# Patient Record
Sex: Female | Born: 1980 | Race: Black or African American | Hispanic: No | Marital: Single | Smoking: Current every day smoker
Health system: Southern US, Community
[De-identification: ages and names within clinical notes are randomized; demographics above are authoritative.]

## PROBLEM LIST (undated history)

## (undated) DIAGNOSIS — F209 Schizophrenia, unspecified: Secondary | ICD-10-CM

---

## 2002-04-05 ENCOUNTER — Inpatient Hospital Stay (HOSPITAL_COMMUNITY): Admission: AD | Admit: 2002-04-05 | Discharge: 2002-04-05 | Payer: Self-pay | Admitting: *Deleted

## 2002-04-13 ENCOUNTER — Encounter (HOSPITAL_COMMUNITY): Admission: RE | Admit: 2002-04-13 | Discharge: 2002-04-13 | Payer: Self-pay | Admitting: *Deleted

## 2002-04-25 ENCOUNTER — Inpatient Hospital Stay (HOSPITAL_COMMUNITY): Admission: AD | Admit: 2002-04-25 | Discharge: 2002-04-27 | Payer: Self-pay | Admitting: Obstetrics and Gynecology

## 2003-02-15 ENCOUNTER — Inpatient Hospital Stay (HOSPITAL_COMMUNITY): Admission: AD | Admit: 2003-02-15 | Discharge: 2003-02-16 | Payer: Self-pay | Admitting: *Deleted

## 2003-02-16 ENCOUNTER — Encounter: Payer: Self-pay | Admitting: *Deleted

## 2003-02-28 ENCOUNTER — Inpatient Hospital Stay (HOSPITAL_COMMUNITY): Admission: AD | Admit: 2003-02-28 | Discharge: 2003-02-28 | Payer: Self-pay | Admitting: Obstetrics & Gynecology

## 2004-03-07 ENCOUNTER — Emergency Department (HOSPITAL_COMMUNITY): Admission: EM | Admit: 2004-03-07 | Discharge: 2004-03-08 | Payer: Self-pay | Admitting: Emergency Medicine

## 2004-10-28 ENCOUNTER — Emergency Department (HOSPITAL_COMMUNITY): Admission: EM | Admit: 2004-10-28 | Discharge: 2004-10-28 | Payer: Self-pay | Admitting: Emergency Medicine

## 2005-03-20 IMAGING — CR DG CHEST 2V
2 series · 2 of 2 positions shown · non-contrast
Comparison: none

HISTORY: Chest pain, cough, fever

CHEST 2 VIEWS
Heart size, mediastinal contours, and vascularity normal.
Lungs clear.
No pneumothorax or pleural effusion.
Bones unremarkable.

[view not recorded (1 of 2)]
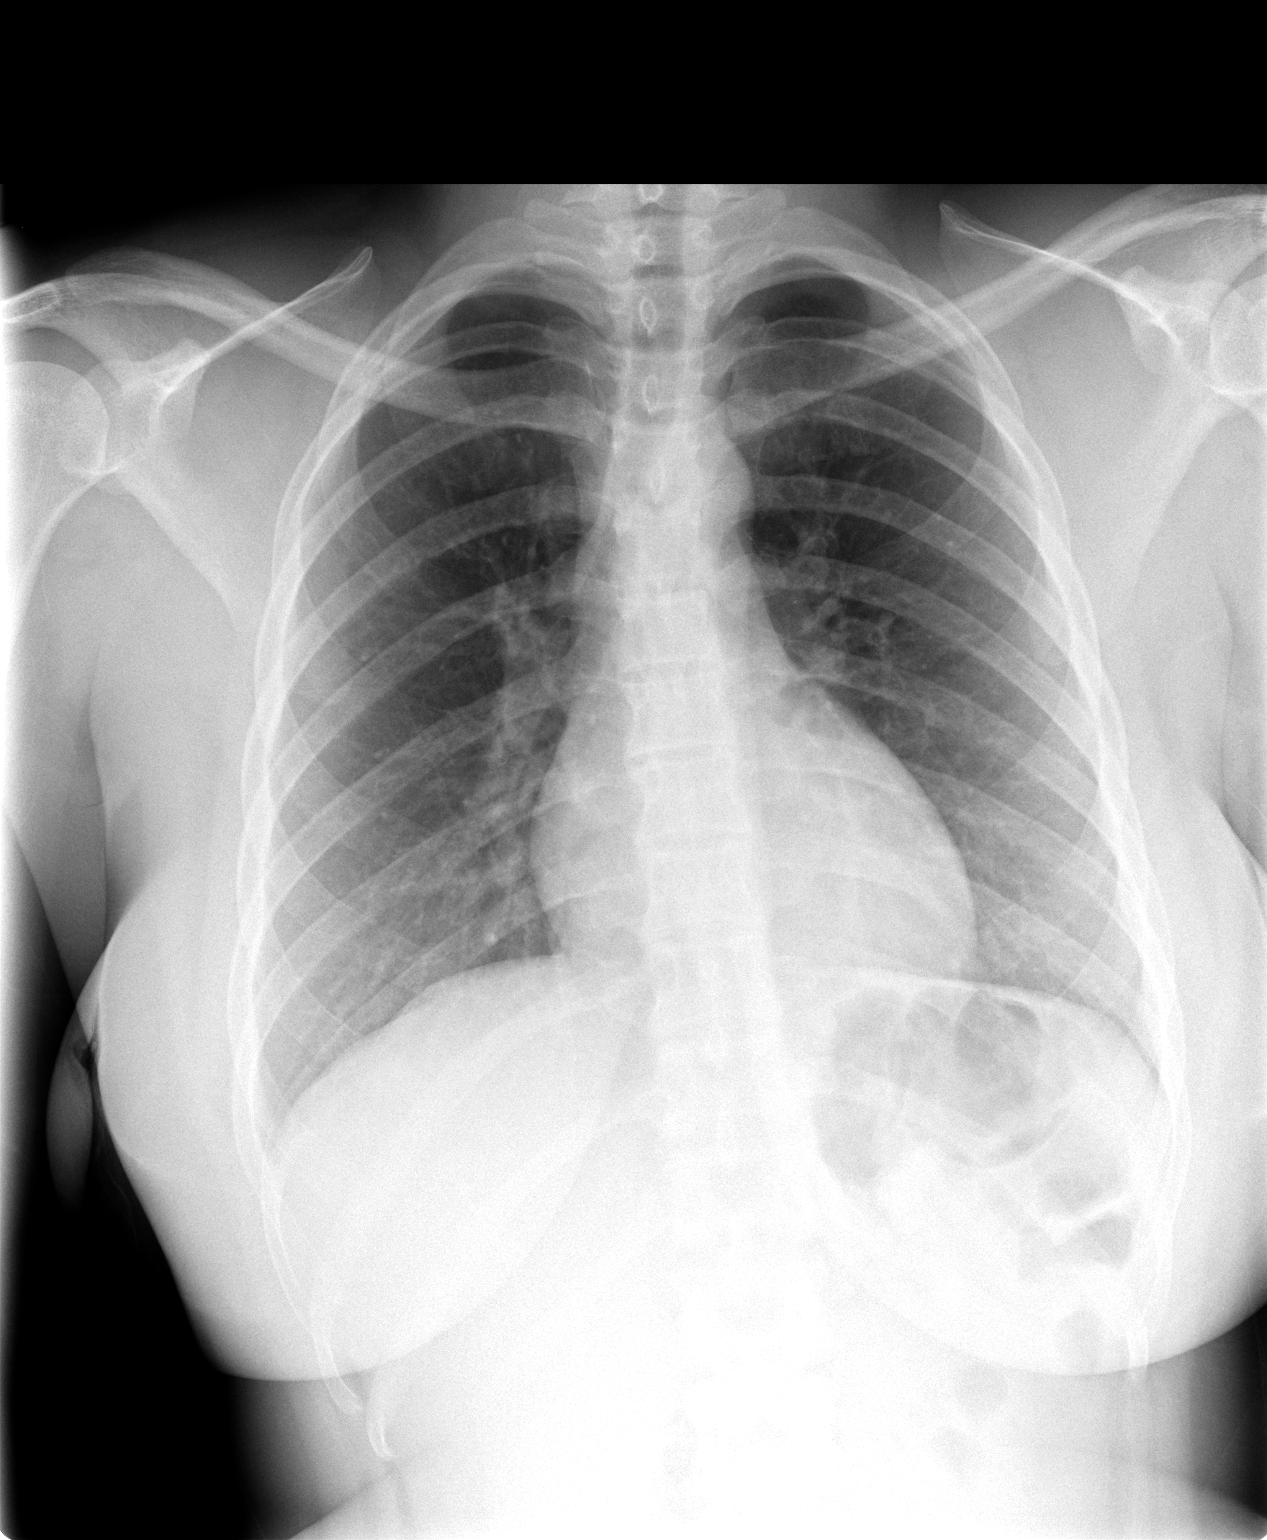

[view not recorded (2 of 2)]
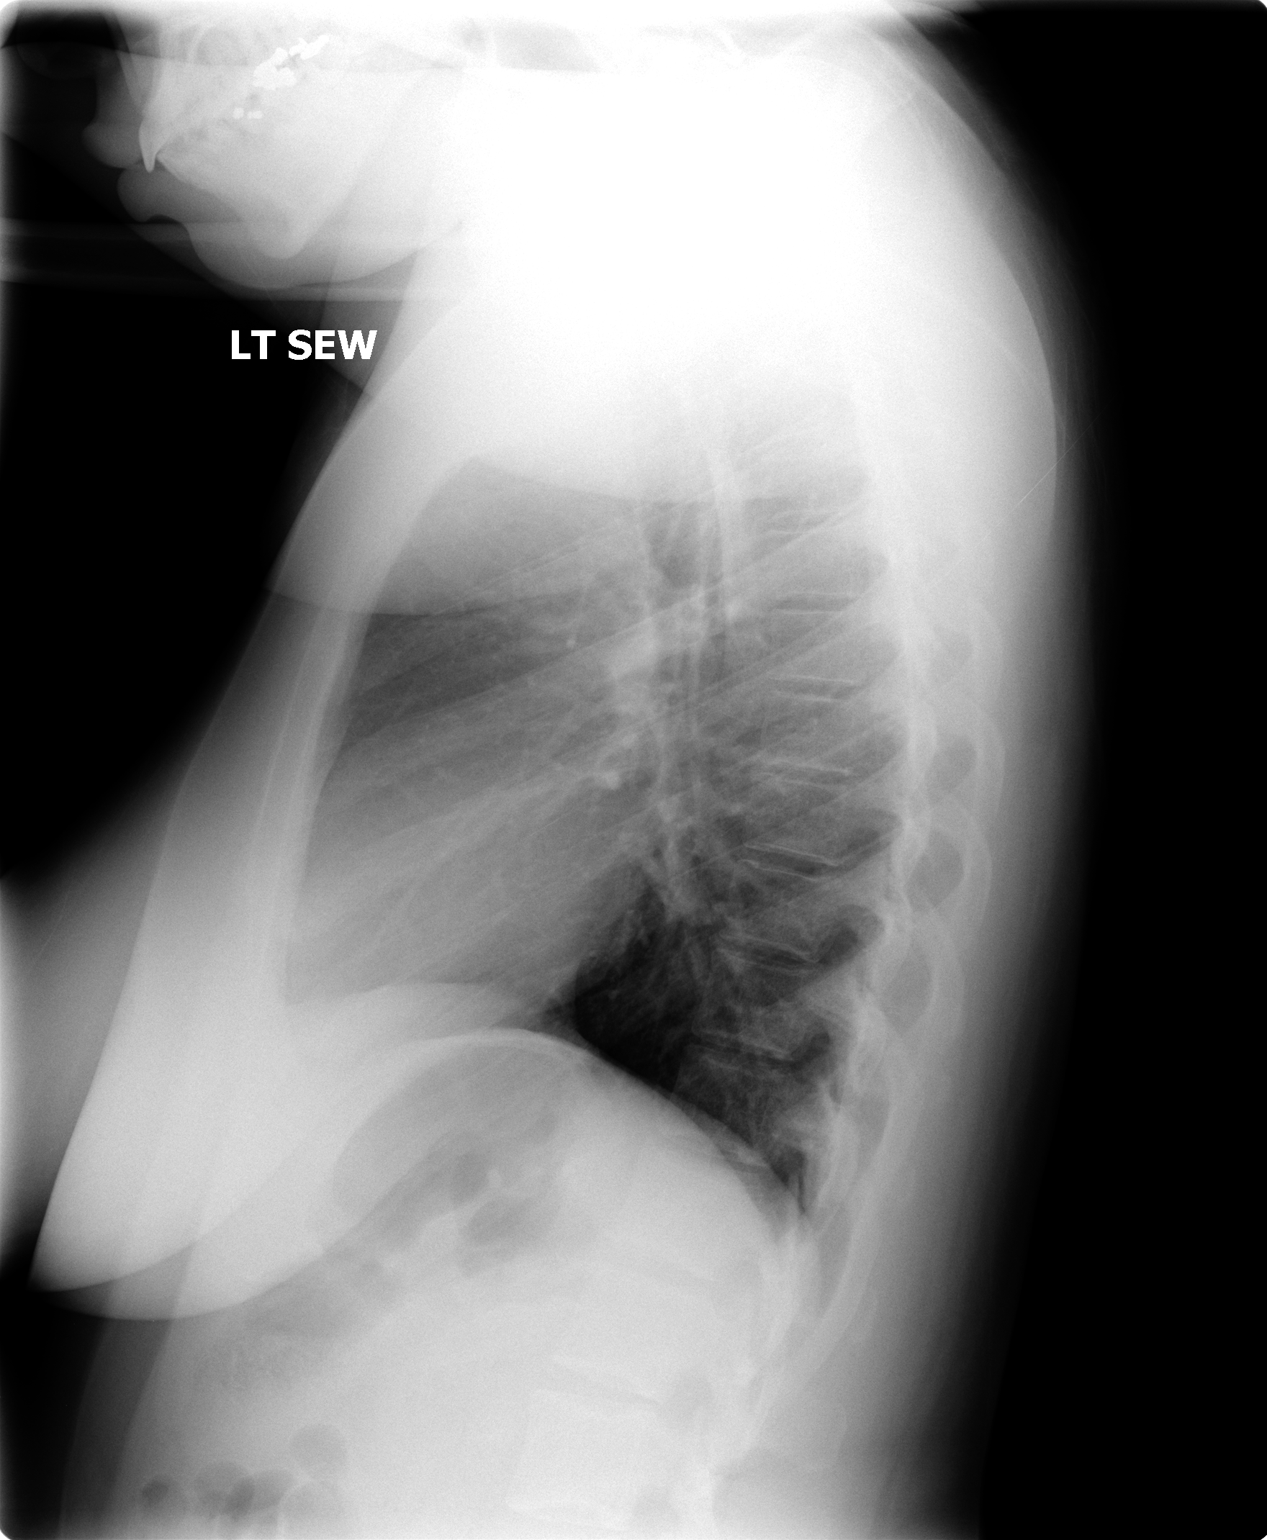

[2 of 2 positions shown; findings below may reference images not displayed]

IMPRESSION: No acute abnormalities.

## 2005-11-10 IMAGING — CR DG HAND COMPLETE 3+V*L*
3 series · 3 of 3 positions shown · non-contrast
Comparison: None.

CLINICAL DATA: Injured left hand.

LEFT HAND - 3 VIEW  10/28/2004:

[view not recorded (1 of 3)]
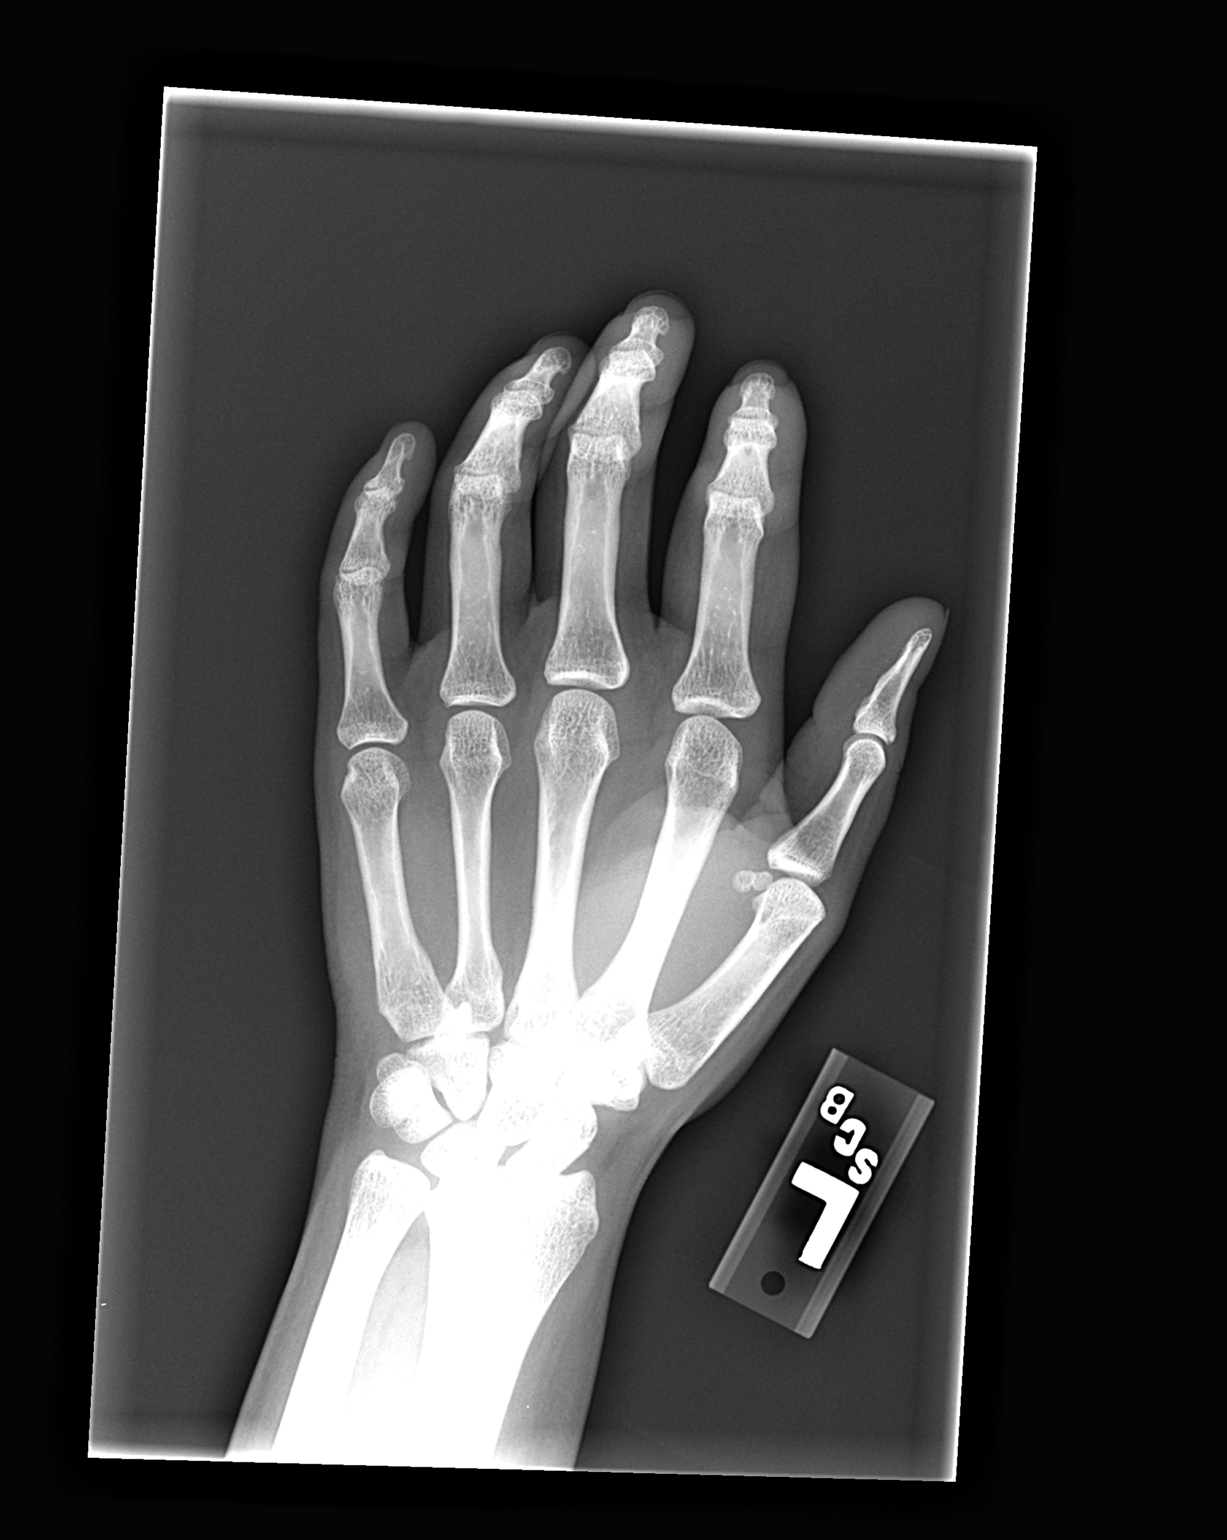

[view not recorded (2 of 3)]
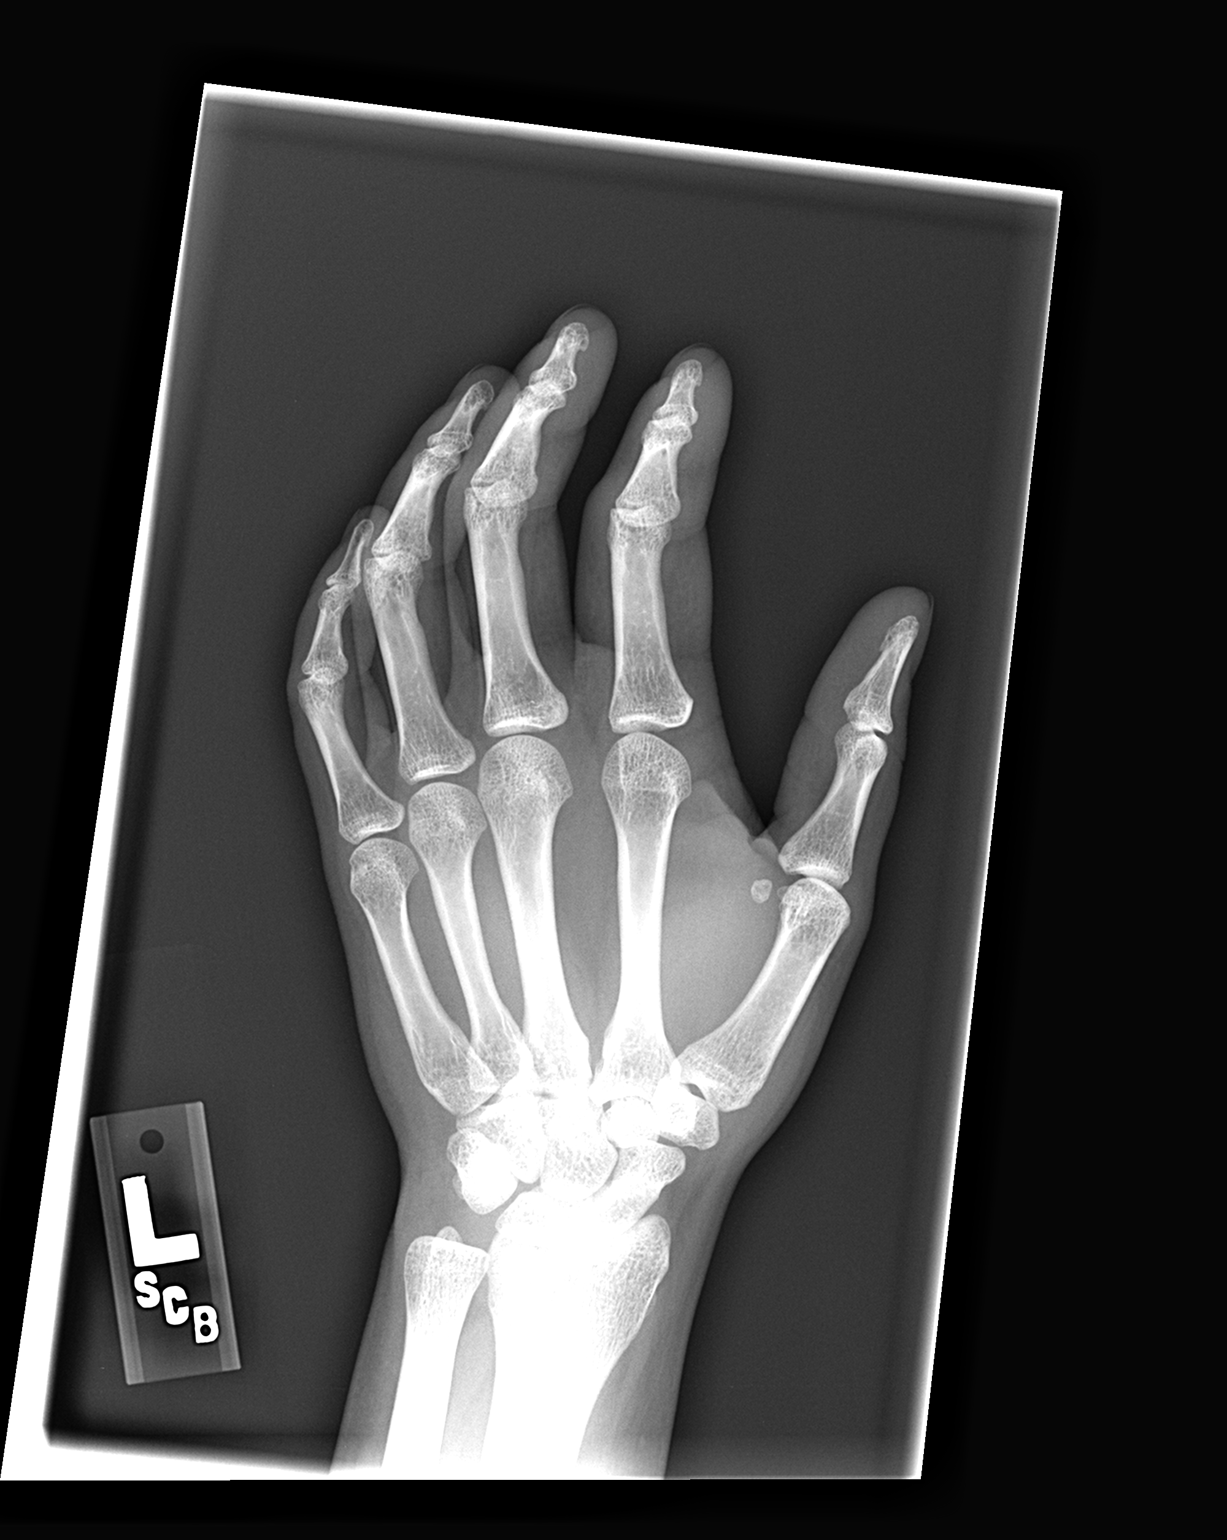

[view not recorded (3 of 3)]
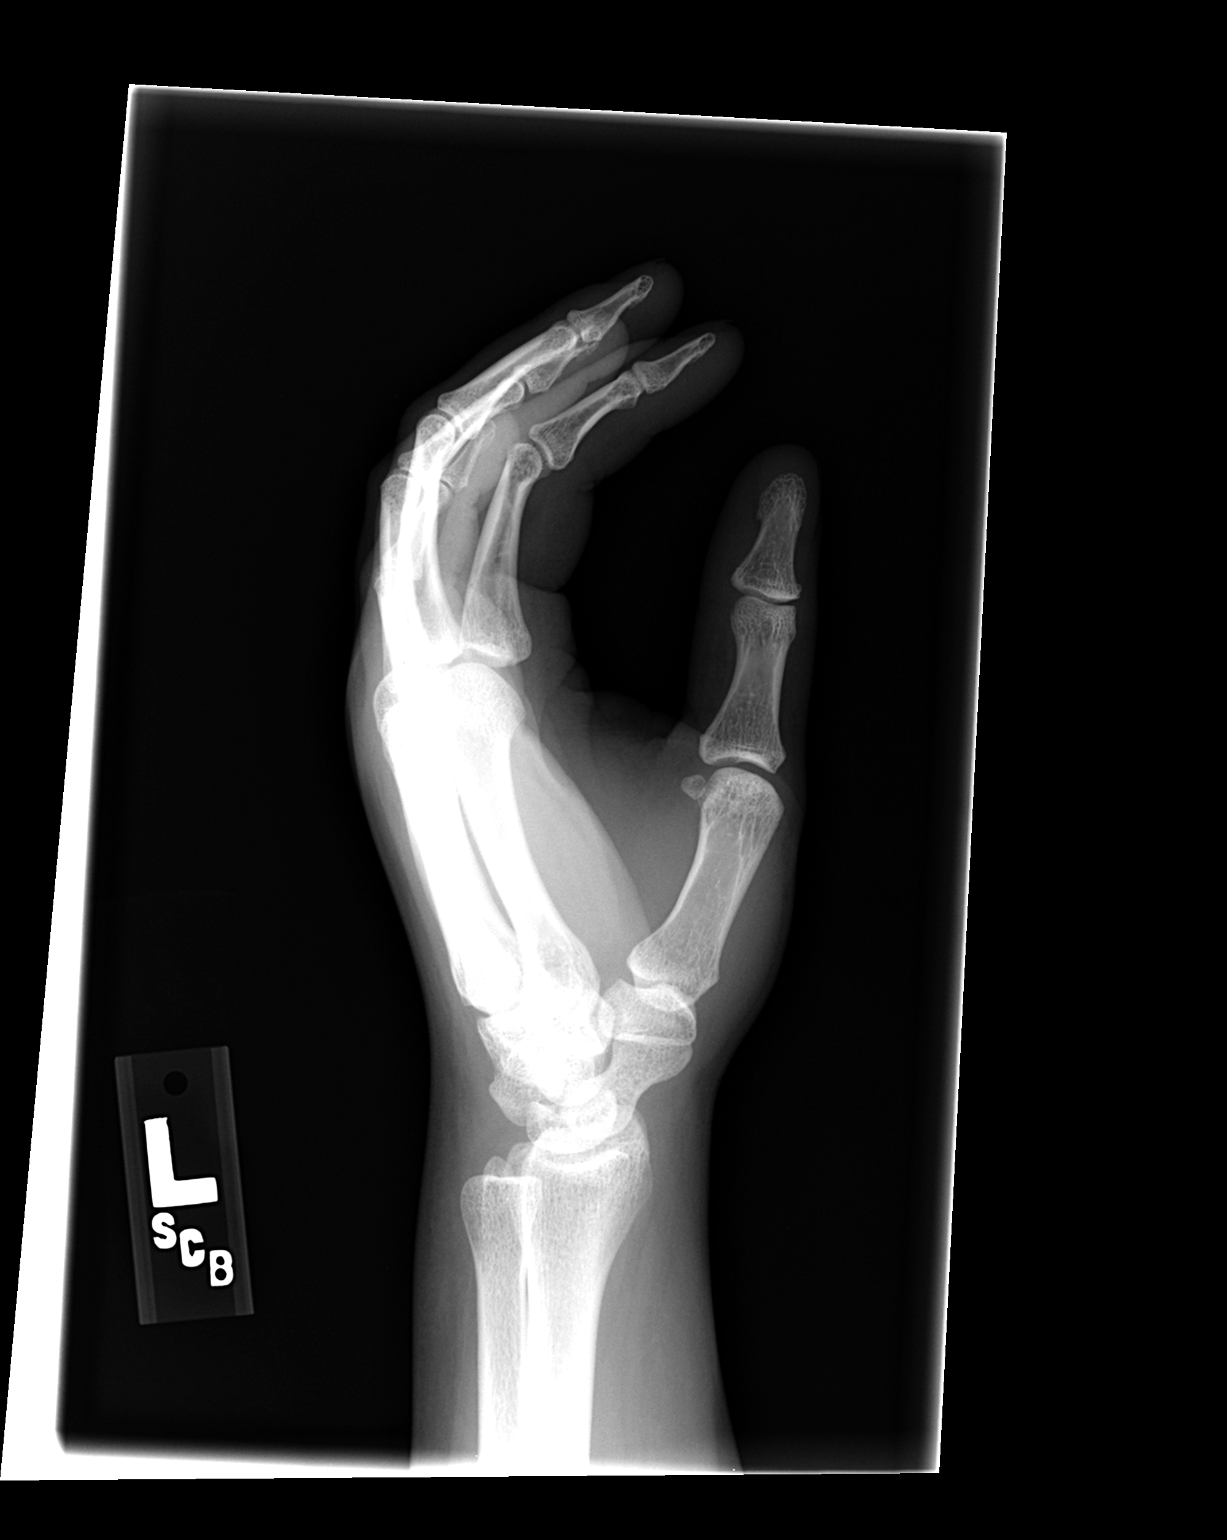

[3 of 3 positions shown; findings below may reference images not displayed]

FINDINGS: There is no evidence of acute fracture or dislocation. The joint
spaces appear well-preserved throughout. There are no intrinsic osseous
abnormalities.
IMPRESSION: Normal examination.

## 2016-08-11 ENCOUNTER — Emergency Department (HOSPITAL_COMMUNITY)
Admission: EM | Admit: 2016-08-11 | Discharge: 2016-08-11 | Disposition: A | Discharge status: Home / Self Care | Payer: Self-pay | Attending: Emergency Medicine | Admitting: Emergency Medicine

## 2016-08-11 ENCOUNTER — Encounter (HOSPITAL_COMMUNITY): Payer: Self-pay | Admitting: Emergency Medicine

## 2016-08-11 DIAGNOSIS — F17200 Nicotine dependence, unspecified, uncomplicated: Secondary | ICD-10-CM | POA: Insufficient documentation

## 2016-08-11 DIAGNOSIS — S29012A Strain of muscle and tendon of back wall of thorax, initial encounter: Secondary | ICD-10-CM | POA: Insufficient documentation

## 2016-08-11 DIAGNOSIS — Z7982 Long term (current) use of aspirin: Secondary | ICD-10-CM | POA: Insufficient documentation

## 2016-08-11 DIAGNOSIS — T148XXA Other injury of unspecified body region, initial encounter: Secondary | ICD-10-CM

## 2016-08-11 DIAGNOSIS — W1830XA Fall on same level, unspecified, initial encounter: Secondary | ICD-10-CM | POA: Insufficient documentation

## 2016-08-11 DIAGNOSIS — Y929 Unspecified place or not applicable: Secondary | ICD-10-CM | POA: Insufficient documentation

## 2016-08-11 DIAGNOSIS — Y999 Unspecified external cause status: Secondary | ICD-10-CM | POA: Insufficient documentation

## 2016-08-11 DIAGNOSIS — Y9301 Activity, walking, marching and hiking: Secondary | ICD-10-CM | POA: Insufficient documentation

## 2016-08-11 DIAGNOSIS — Z79899 Other long term (current) drug therapy: Secondary | ICD-10-CM | POA: Insufficient documentation

## 2016-08-11 MED ORDER — METHOCARBAMOL 500 MG PO TABS
500.0000 mg | ORAL_TABLET | Freq: Once | ORAL | Status: AC
  Administered 2016-08-11: 500 mg via ORAL
  Filled 2016-08-11: qty 1

## 2016-08-11 MED ORDER — ACETAMINOPHEN 500 MG PO TABS
1000.0000 mg | ORAL_TABLET | Freq: Once | ORAL | Status: AC
  Administered 2016-08-11: 1000 mg via ORAL
  Filled 2016-08-11: qty 2

## 2016-08-11 MED ORDER — ACETAMINOPHEN 500 MG PO TABS: 1000.0000 mg | ORAL_TABLET | Freq: Three times a day (TID) | ORAL | 0 refills | Status: AC

## 2016-08-11 MED ORDER — METHOCARBAMOL 500 MG PO TABS: 500.0000 mg | ORAL_TABLET | Freq: Two times a day (BID) | ORAL | 0 refills | Status: AC

## 2016-08-11 MED ORDER — KETOROLAC TROMETHAMINE 60 MG/2ML IM SOLN
30.0000 mg | Freq: Once | INTRAMUSCULAR | Status: DC
  Filled 2016-08-11: qty 2

## 2016-08-11 NOTE — ED Triage Notes (Addendum)
Pt from home with right sided back pain. Pt states she was walking 2 days ago, fell over and has a knot where she fell. Pt sustained no other injuries. Pt is ambulatory. [facility] and she was able to carry her bags with no difficulty.

## 2016-08-11 NOTE — ED Provider Notes (Signed)
[facility]-EMERGENCY DEPT Provider Note   CSN: [id number] Arrival date & time: 08/11/16  1721     History   Chief Complaint Chief Complaint  Patient presents with  . Back Pain  . Tachycardia    HPI Sarah Mcbride is a 36 y.o. female.  The history is provided by the patient.  Back Pain   This is a new problem. The current episode started 2 days ago. The problem occurs constantly. Progression since onset: waxing and waning. The pain is associated with falling (mechanical fall from standing; landing on edge of bed with right side of thorax). The quality of the pain is described as shooting and cramping. The pain does not radiate. The pain is moderate. The symptoms are aggravated by bending, twisting and certain positions. Pertinent negatives include no chest pain, no fever, no headaches, no abdominal pain, no abdominal swelling, no bowel incontinence, no perianal numbness, no bladder incontinence and no paresthesias. She has tried nothing for the symptoms.    History reviewed. No pertinent past medical history.  There are no active problems to display for this patient.   History reviewed. No pertinent surgical history.  OB History    No data available       Home Medications    Prior to Admission medications   Medication Sig Start Date End Date Taking? Authorizing Provider  Aspirin-Acetaminophen-Caffeine (GOODY HEADACHE PO) Take 1 packet by mouth every 2 (two) hours as needed (headache).   Yes Historical Provider, MD  Cal Carb-Mag Hydrox-Simeth (ROLAIDS ADVANCED) 1000-200-40 MG CHEW Chew 2 tablets by mouth as needed (heart burn).   Yes Historical Provider, MD  ibuprofen (ADVIL,MOTRIN) 200 MG tablet Take 400-600 mg by mouth every 6 (six) hours as needed for headache.   Yes Historical Provider, MD  acetaminophen (TYLENOL) 500 MG tablet Take 2 tablets (1,000 mg total) by mouth every 8 (eight) hours. Do not take more than 4000 mg of acetaminophen (Tylenol) in a 24-hour period. Please  note that other medicines that you may be prescribed may have Tylenol as well. 08/11/16 08/16/16  Nira ConnPedro Eduardo Cleophas Yoak, MD  methocarbamol (ROBAXIN) 500 MG tablet Take 1 tablet (500 mg total) by mouth 2 (two) times daily. 08/11/16   Nira ConnPedro Eduardo Bernetta Sutley, MD    Family History No family history on file.  Social History Social History  Substance Use Topics  . Smoking status: Current Every Day Smoker    Packs/day: 0.50  . Smokeless tobacco: Never Used  . Alcohol use Yes     Comment: rarely     Allergies   Flexeril [cyclobenzaprine]; Sulfa antibiotics; and Tramadol   Review of Systems Review of Systems  Constitutional: Negative for fever.  Cardiovascular: Negative for chest pain.  Gastrointestinal: Negative for abdominal pain and bowel incontinence.  Genitourinary: Negative for bladder incontinence.  Musculoskeletal: Positive for back pain.  Neurological: Negative for headaches and paresthesias.   Ten systems are reviewed and are negative for acute change except as noted in the HPI   Physical Exam Updated Vital Signs BP 136/73 (BP Location: Left Arm)   Pulse (!) 131   Temp 98.6 F (37 C) (Oral)   Resp 20   Ht 5\' 1"  (1.549 m)   Wt 168 lb (76.2 kg)   SpO2 96%   BMI 31.74 kg/m   Physical Exam  Constitutional: She is oriented to person, place, and time. She appears well-developed and well-nourished. No distress.  HENT:  Head: Normocephalic and atraumatic.  Nose: Nose normal.  Eyes: Conjunctivae  and EOM are normal. Pupils are equal, round, and reactive to light. Right eye exhibits no discharge. Left eye exhibits no discharge. No scleral icterus.  Neck: Normal range of motion. Neck supple.  Cardiovascular: Normal rate and regular rhythm.  Exam reveals no gallop and no friction rub.   No murmur heard. Pulmonary/Chest: Effort normal and breath sounds normal. No stridor. No respiratory distress. She has no rales.  Abdominal: Soft. She exhibits no distension. There is no  tenderness.  Musculoskeletal: She exhibits no edema.       Cervical back: She exhibits no bony tenderness.       Thoracic back: She exhibits tenderness. She exhibits no bony tenderness.       Lumbar back: She exhibits no tenderness and no bony tenderness.       Back:  TTP to the right paraspinal muscles for the thoracic back  Neurological: She is alert and oriented to person, place, and time.  Skin: Skin is warm and dry. No rash noted. She is not diaphoretic. No erythema.  Psychiatric: She has a normal mood and affect.  Vitals reviewed.    ED Treatments / Results  Labs (all labs ordered are listed, but only abnormal results are displayed) Labs Reviewed - No data to display  EKG  EKG Interpretation None       Radiology No results found.  Procedures Procedures (including critical care time)  Medications Ordered in ED Medications  acetaminophen (TYLENOL) tablet 1,000 mg (not administered)  methocarbamol (ROBAXIN) tablet 500 mg (not administered)     Initial Impression / Assessment and Plan / ED Course  I have reviewed the triage vital signs and the nursing notes.  Pertinent labs & imaging results that were available during my care of the patient were reviewed by me and considered in my medical decision making (see chart for details).     36 y.o. female presents with back pain following mechanical fall. No red flag symptoms of fever, weight loss, saddle anesthesia, weakness, fecal/urinary incontinence or urinary retention. No urinary symptoms suggestive of UTI/pyelonephritis. Not consistent with renal colic. Abdomen benign and not suggestive of biliary disease. Low suspicion for rib fracture.  Suspect MSK etiology. No indication for imaging emergently. Patient was recommended to take short course of scheduled Tylenol and engage in early mobility as definitive treatment. Return precautions discussed for worsening or new concerning symptoms.    Final Clinical  Impressions(s) / ED Diagnoses   Final diagnoses:  Muscle strain   Disposition: Discharge  Condition: Good  I have discussed the results, Dx and Tx plan with the patient who expressed understanding and agree(s) with the plan. Discharge instructions discussed at great length. The patient was given strict return precautions who verbalized understanding of the instructions. No further questions at time of discharge.    New Prescriptions   ACETAMINOPHEN (TYLENOL) 500 MG TABLET    Take 2 tablets (1,000 mg total) by mouth every 8 (eight) hours. Do not take more than 4000 mg of acetaminophen (Tylenol) in a 24-hour period. Please note that other medicines that you may be prescribed may have Tylenol as well.   METHOCARBAMOL (ROBAXIN) 500 MG TABLET    Take 1 tablet (500 mg total) by mouth 2 (two) times daily.    Follow Up: primary care provider  Schedule an appointment as soon as possible for a visit  As needed      Nira Conn, MD 08/11/16 1942

## 2016-08-12 ENCOUNTER — Emergency Department (HOSPITAL_COMMUNITY)
Admission: EM | Admit: 2016-08-12 | Discharge: 2016-08-13 | Disposition: A | Discharge status: Home / Self Care | Payer: Federal, State, Local not specified - Other | Attending: Emergency Medicine | Admitting: Emergency Medicine

## 2016-08-12 ENCOUNTER — Encounter (HOSPITAL_COMMUNITY): Payer: Self-pay | Admitting: Emergency Medicine

## 2016-08-12 DIAGNOSIS — F1910 Other psychoactive substance abuse, uncomplicated: Secondary | ICD-10-CM

## 2016-08-12 DIAGNOSIS — Z79899 Other long term (current) drug therapy: Secondary | ICD-10-CM | POA: Insufficient documentation

## 2016-08-12 DIAGNOSIS — F17200 Nicotine dependence, unspecified, uncomplicated: Secondary | ICD-10-CM | POA: Insufficient documentation

## 2016-08-12 DIAGNOSIS — F23 Brief psychotic disorder: Secondary | ICD-10-CM

## 2016-08-12 DIAGNOSIS — F312 Bipolar disorder, current episode manic severe with psychotic features: Secondary | ICD-10-CM | POA: Diagnosis present

## 2016-08-12 DIAGNOSIS — Z7982 Long term (current) use of aspirin: Secondary | ICD-10-CM | POA: Insufficient documentation

## 2016-08-12 DIAGNOSIS — F29 Unspecified psychosis not due to a substance or known physiological condition: Secondary | ICD-10-CM | POA: Insufficient documentation

## 2016-08-12 DIAGNOSIS — F419 Anxiety disorder, unspecified: Secondary | ICD-10-CM

## 2016-08-12 HISTORY — DX: Schizophrenia, unspecified: F20.9

## 2016-08-12 LAB — COMPREHENSIVE METABOLIC PANEL
ALBUMIN: 4.2 g/dL (ref 3.5–5.0)
ALK PHOS: 101 U/L (ref 38–126)
ALT: 24 U/L (ref 14–54)
AST: 29 U/L (ref 15–41)
Anion gap: 12 (ref 5–15)
BILIRUBIN TOTAL: 0.9 mg/dL (ref 0.3–1.2)
BUN: 11 mg/dL (ref 6–20)
CO2: 22 mmol/L (ref 22–32)
Calcium: 9.4 mg/dL (ref 8.9–10.3)
Chloride: 102 mmol/L (ref 101–111)
Creatinine, Ser: 0.79 mg/dL (ref 0.44–1.00)
GFR calc Af Amer: >60 mL/min (ref >60)
GFR calc non Af Amer: >60 mL/min (ref >60)
GLUCOSE: 130 mg/dL — AB (ref 65–99)
POTASSIUM: 3.7 mmol/L (ref 3.5–5.1)
SODIUM: 136 mmol/L (ref 135–145)
Total Protein: 8.1 g/dL (ref 6.5–8.1)

## 2016-08-12 LAB — CBC WITH DIFFERENTIAL/PLATELET
BASOS PCT: 0 %
Basophils Absolute: 0 10*3/uL (ref 0.0–0.1)
EOS ABS: 0.1 10*3/uL (ref 0.0–0.7)
EOS PCT: 1 %
HEMATOCRIT: 34.6 % — AB (ref 36.0–46.0)
HEMOGLOBIN: 11.2 g/dL — AB (ref 12.0–15.0)
LYMPHS PCT: 37 %
Lymphs Abs: 4.3 10*3/uL — ABNORMAL HIGH (ref 0.7–4.0)
MCH: 25.3 pg — AB (ref 26.0–34.0)
MCHC: 32.4 g/dL (ref 30.0–36.0)
MCV: 78.1 fL (ref 78.0–100.0)
MONOS PCT: 12 %
Monocytes Absolute: 1.4 10*3/uL — ABNORMAL HIGH (ref 0.1–1.0)
NEUTROS ABS: 5.7 10*3/uL (ref 1.7–7.7)
Neutrophils Relative %: 50 %
Platelets: 257 10*3/uL (ref 150–400)
RBC: 4.43 MIL/uL (ref 3.87–5.11)
RDW: 17.2 % — ABNORMAL HIGH (ref 11.5–15.5)
WBC: 11.5 10*3/uL — ABNORMAL HIGH (ref 4.0–10.5)

## 2016-08-12 LAB — ETHANOL: Alcohol, Ethyl (B): <5 mg/dL (ref <5)

## 2016-08-12 MED ORDER — ONDANSETRON HCL 4 MG PO TABS: 4.0000 mg | ORAL_TABLET | Freq: Three times a day (TID) | ORAL | Status: DC | PRN

## 2016-08-12 MED ORDER — LORAZEPAM 1 MG PO TABS
1.0000 mg | ORAL_TABLET | Freq: Three times a day (TID) | ORAL | Status: DC | PRN
  Administered 2016-08-12 – 2016-08-13 (×2): 1 mg via ORAL
  Filled 2016-08-12 (×2): qty 1

## 2016-08-12 MED ORDER — ZIPRASIDONE MESYLATE 20 MG IM SOLR
10.0000 mg | Freq: Once | INTRAMUSCULAR | Status: AC
  Administered 2016-08-12: 10 mg via INTRAMUSCULAR
  Filled 2016-08-12: qty 20

## 2016-08-12 MED ORDER — IBUPROFEN 400 MG PO TABS: 600.0000 mg | ORAL_TABLET | Freq: Three times a day (TID) | ORAL | Status: DC | PRN

## 2016-08-12 MED ORDER — LORAZEPAM 2 MG/ML IJ SOLN
1.0000 mg | Freq: Once | INTRAMUSCULAR | Status: AC
  Administered 2016-08-12: 1 mg via INTRAMUSCULAR
  Filled 2016-08-12: qty 1

## 2016-08-12 NOTE — ED Notes (Signed)
Pt was given snack 

## 2016-08-12 NOTE — ED Notes (Signed)
Pt walked out of room saying she left her phone in the front lobby. Pt started wandering around the unit trying to find where she left it. This RN tried to redirect pt into room, reassuring pt that security would look for her phone. Then pt swatted at this RN's hand. Pt went back into room. Security at bedside. MD aware.

## 2016-08-12 NOTE — ED Notes (Signed)
Belongings are locked up behind nurse station (Pod F 1-6). Valuables are locked up with security.

## 2016-08-12 NOTE — ED Notes (Addendum)
Regular Diet was ordered for Lunch, a pack of Cookies and Drink placed on pt. Bedside table.

## 2016-08-12 NOTE — ED Notes (Signed)
Pt made first phone call  

## 2016-08-12 NOTE — ED Notes (Signed)
Pt went back to resting after TTS was complete.

## 2016-08-12 NOTE — ED Notes (Addendum)
Pt did not eat snack nor did she drink anything. Pt is still resting

## 2016-08-12 NOTE — ED Notes (Signed)
Pt wanded by security. Changed into paper scrubs. Belongings placed in Pod A nurse's station, not inventoried at this time.

## 2016-08-12 NOTE — ED Notes (Signed)
Pt is resting.
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
## 2016-08-12 NOTE — ED Notes (Addendum)
Pt still resting. Pt did not eat breakfast tray.
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
## 2016-08-12 NOTE — ED Notes (Signed)
Dinner tray at bedside

## 2016-08-12 NOTE — ED Notes (Signed)
Pt lunch tray at bedside  

## 2016-08-12 NOTE — ED Notes (Signed)
Pt breakfast tray at bedside. Pt is resting.

## 2016-08-12 NOTE — ED Notes (Signed)
Pt is eating lunch and waiting it TTS

## 2016-08-12 NOTE — ED Notes (Signed)
Pt resting.

## 2016-08-12 NOTE — ED Provider Notes (Signed)
[facility]-EMER[facility]ENCY DEPT Provider Note   CSN: [id number] Arrival date & time: 08/12/16  0303  By signing my name below, I, Elder NegusRussell Johnston, attest that this documentation has been prepared under the direction and in the presence of Gilda Creasehristopher J Tatijana Bierly, MD. Electronically Signed: Elder Negusussell Johnston, Scribe. 08/12/16. 3:28 AM.   History   Chief Complaint Chief Complaint  Patient presents with  . Psychiatric Evaluation   LEVEL 5 CAVEAT DUE TO PSYCHIATRIC PROBLEM  HPI Sarah Mcbride is a 36 y.o. female with history of schizophrenia reportedly on Seroquil and Ativan presents to the ED for psychiatric evaluation. This patient states that "she is freaking out" because "she cannot talk to her mother". She then proceeds to state that "her mother killed her sister and the police had to pull her out of the river." She denies any suicidal or homicidal ideations. She does report being on psychiatric medications; but states that "she lost them" and hasn't been taking them "for a few weeks". She has not been sleeping for "3 days".   The history is provided by the patient. No language interpreter was used.    Past Medical History:  Diagnosis Date  . Schizophrenia (HCC)     There are no active problems to display for this patient.   History reviewed. No pertinent surgical history.  OB History    No data available       Home Medications    Prior to Admission medications   Medication Sig Start Date End Date Taking? Authorizing Provider  acetaminophen (TYLENOL) 500 MG tablet Take 2 tablets (1,000 mg total) by mouth every 8 (eight) hours. Do not take more than 4000 mg of acetaminophen (Tylenol) in a 24-hour period. Please note that other medicines that you may be prescribed may have Tylenol as well. 08/11/16 08/16/16  Nira ConnPedro Eduardo Cardama, MD  Aspirin-Acetaminophen-Caffeine (GOODY HEADACHE PO) Take 1 packet by mouth every 2 (two) hours as needed (headache).    Historical Provider, MD  Cal  Carb-Mag Hydrox-Simeth (ROLAIDS ADVANCED) 1000-200-40 MG CHEW Chew 2 tablets by mouth as needed (heart burn).    Historical Provider, MD  ibuprofen (ADVIL,MOTRIN) 200 MG tablet Take 400-600 mg by mouth every 6 (six) hours as needed for headache.    Historical Provider, MD  methocarbamol (ROBAXIN) 500 MG tablet Take 1 tablet (500 mg total) by mouth 2 (two) times daily. 08/11/16   Nira ConnPedro Eduardo Cardama, MD    Family History No family history on file.  Social History Social History  Substance Use Topics  . Smoking status: Current Every Day Smoker    Packs/day: 0.50  . Smokeless tobacco: Never Used  . Alcohol use Yes     Comment: rarely     Allergies   Flexeril [cyclobenzaprine]; Sulfa antibiotics; and Tramadol   Review of Systems Review of Systems  Unable to perform ROS: Psychiatric disorder     Physical Exam Updated Vital Signs Ht 5\' 1"  (1.549 m)   Wt 168 lb (76.2 kg)   BMI 31.74 kg/m   Physical Exam  Constitutional: She is oriented to person, place, and time. She appears well-developed and well-nourished. No distress.  HENT:  Head: Normocephalic and atraumatic.  Right Ear: Hearing normal.  Left Ear: Hearing normal.  Nose: Nose normal.  Mouth/Throat: Oropharynx is clear and moist and mucous membranes are normal.  Eyes: Conjunctivae and EOM are normal. Pupils are equal, round, and reactive to light.  Neck: Normal range of motion. Neck supple.  Cardiovascular: Regular rhythm, S1 normal and  S2 normal.  Exam reveals no gallop and no friction rub.   No murmur heard. Pulmonary/Chest: Effort normal and breath sounds normal. No respiratory distress. She exhibits no tenderness.  Abdominal: Soft. Normal appearance and bowel sounds are normal. There is no hepatosplenomegaly. There is no tenderness. There is no rebound, no guarding, no tenderness at McBurney's point and negative Murphy's sign. No hernia.  Musculoskeletal: Normal range of motion.  Neurological: She is alert and  oriented to person, place, and time. She has normal strength. No cranial nerve deficit or sensory deficit. Coordination normal. GCS eye subscore is 4. GCS verbal subscore is 5. GCS motor subscore is 6.  Skin: Skin is warm, dry and intact. No rash noted. No cyanosis.  Psychiatric: Her mood appears anxious. Her affect is labile. Her speech is rapid and/or pressured. She is agitated and hyperactive. Thought content is paranoid and delusional.  Nursing note and vitals reviewed.    ED Treatments / Results  Labs (all labs ordered are listed, but only abnormal results are displayed) Labs Reviewed  COMPREHENSIVE METABOLIC PANEL  ETHANOL  CBC WITH DIFFERENTIAL/PLATELET  RAPID URINE DRUG SCREEN, HOSP PERFORMED    EKG  EKG Interpretation None       Radiology No results found.  Procedures Procedures (including critical care time)  Medications Ordered in ED Medications  ziprasidone (GEODON) injection 10 mg (not administered)  LORazepam (ATIVAN) injection 1 mg (not administered)     Initial Impression / Assessment and Plan / ED Course  I have reviewed the triage vital signs and the nursing notes.  Pertinent labs & imaging results that were available during my care of the patient were reviewed by me and considered in my medical decision making (see chart for details).     Patient presents with extreme agitation. She has a history of paranoid schizophrenia. Patient has been off her medications for an unknown period of time. She is paranoid and delusional, will require psychiatric evaluation for further treatment.  Final Clinical Impressions(s) / ED Diagnoses   Final diagnoses:  Acute psychosis    New Prescriptions New Prescriptions   No medications on file  I personally performed the services described in this documentation, which was scribed in my presence. The recorded information has been reviewed and is accurate.    Gilda Crease, MD 08/12/16 [phone]

## 2016-08-13 ENCOUNTER — Inpatient Hospital Stay (HOSPITAL_COMMUNITY)
Admission: AD | Admit: 2016-08-13 | Payer: Federal, State, Local not specified - Other | Source: Intra-hospital | Admitting: Psychiatry

## 2016-08-13 DIAGNOSIS — F17210 Nicotine dependence, cigarettes, uncomplicated: Secondary | ICD-10-CM | POA: Diagnosis not present

## 2016-08-13 DIAGNOSIS — F23 Brief psychotic disorder: Secondary | ICD-10-CM | POA: Diagnosis not present

## 2016-08-13 DIAGNOSIS — F312 Bipolar disorder, current episode manic severe with psychotic features: Secondary | ICD-10-CM | POA: Diagnosis present

## 2016-08-13 DIAGNOSIS — Z79899 Other long term (current) drug therapy: Secondary | ICD-10-CM

## 2016-08-13 DIAGNOSIS — F1290 Cannabis use, unspecified, uncomplicated: Secondary | ICD-10-CM | POA: Diagnosis not present

## 2016-08-13 LAB — RAPID URINE DRUG SCREEN, HOSP PERFORMED
Amphetamines: NOT DETECTED
BARBITURATES: NOT DETECTED
Benzodiazepines: POSITIVE — AB
Cocaine: NOT DETECTED
Opiates: NOT DETECTED
TETRAHYDROCANNABINOL: POSITIVE — AB

## 2016-08-13 LAB — POC URINE PREG, ED: PREG TEST UR: NEGATIVE

## 2016-08-13 MED ORDER — QUETIAPINE FUMARATE 25 MG PO TABS
25.0000 mg | ORAL_TABLET | Freq: Every day | ORAL | Status: DC
  Administered 2016-08-13: 25 mg via ORAL
  Filled 2016-08-13: qty 1

## 2016-08-13 MED ORDER — QUETIAPINE FUMARATE 25 MG PO TABS: ORAL_TABLET | ORAL | 0 refills | Status: AC

## 2016-08-13 NOTE — ED Notes (Signed)
Pt given breakfast tray

## 2016-08-13 NOTE — ED Notes (Addendum)
Patient stated she need to get her father phone Number, so we had to ask Security if we could get pt. Phone to get the Phone Number, the pt. Got number and wrote it on a piece of paper. We had to do a new security envelope too lock up, and Patient Belongings was placed in soiled Utility room in [place].

## 2016-08-13 NOTE — ED Notes (Signed)
Pt given lunch tray.

## 2016-08-13 NOTE — Consult Note (Signed)
Telepsych Consultation   Reason for Consult:  Psychosis Referring Physician:  EDP Patient Identification: TERENCE GOOGE MRN:  [id number] Principal Diagnosis: Severe manic bipolar 1 disorder with psychotic behavior [facility]) Diagnosis:   Patient Active Problem List   Diagnosis Date Noted  . Severe manic bipolar 1 disorder with psychotic behavior ([place]) [F31.2] 08/13/2016    Priority: High  . Acute psychosis [F23]     Total Time spent with patient: 30 minutes  Subjective:   Amandamarie D Garant is a 36 y.o. female patient admitted with reports of erratic behavior consistent with mania and pt report of being awake for 3 days. Pt seen and chart reviewed. Pt is alert/oriented x3, very anxious, agitated, and struggling to focus on the assessment. Pt denies suicidal/homicidal ideation and psychosis. However, pt does appear to be responding to internal stimuli as her eyes are shifting away from the camera often. Additionally, nursing staff report that pt has been erratic, agitated, hostile, and cursing loudly at staff members in addition to being seen having full conversations with herself in her room this morning. Based on this information, pt will benefit from inpatient hospitalization for stabilization, if she chooses to sign in voluntarily. Careful face-to-face reevaluation by Dr. Eulis Foster, EDP, finds that the pt was transiently agitated/labile and is stable enough to go home, not meeting IVC criteria.  Pt does report hx of taking Seroquel 11m po bid and 526mpo qhs which is consistent with what we might start a new patient on. We can start this med at this dose and titrate upward and use PRN's as needed in the meantime (such as Zyprexa 25m225mydis po or IM q8h prn agitation). Qtc 454 and safe for these meds.    HPI:  I have reviewed and concur with HPI elements below, modified as follows: "Vala D FeiMungin an [age]o. female. Per EDP, Dr. PolWaverly FerrariCandis D Feimsteris a [age]o.femalewith history of  schizophrenia reportedly on Seroquil and Ativan presents to the ED for psychiatric evaluation. This patient states that "she is freaking out" because "she cannot talk to her mother". She then proceeds to state that "her mother killed her sister and the police had to pull her [place] of the river." She denies any suicidal or homicidal ideations. She does report being on psychiatric medications; but states that "she lost them" and hasn't been taking them "for a few weeks". She has not been sleeping for "3 days".   Pt tells wriProbation officerat she is here because her daughter is in a gang and is hurting others and she is worried about her. Pt has a history of schizpohrenia and says she came to the hospital herself in a cab. She states that she has no OP treatment and has [facility] can't remember [facility]. She states that she used to take Seroquel and Ativan, but has none now. She states that she has been using "Mollys" off and on and says her last use was a couple of days ago. She reluctantly admits to hearing voices and seeing things (hallucinations), but says they don't bother her. Pt is disoriented to date and time, and thinks it is July. She states she has no where to live and thinks she is in danger due to her daughter's gang activity.   PT denies having homicidal ideation/ history of violence, but states she has some thoughts of harming her daughter, but would never do it. Pt states current stressors include financial, having  no supports and no place to live.  Pt denies legal history."  Pt has been hostile and combative per nursing reports and would meet IVC due to her mania, being awake 3 days, hostility, and presentation as responding to internal stimuli which would limit her ability to complete ADL's. This may also contribute to her being a possible danger to others, which could be the basis of the IVC.   Past Psychiatric History: Bipolar 1, mixed  Risk to Self: Suicidal  Ideation: No Suicidal Intent: No Is patient at risk for suicide?: Yes Suicidal Plan?: No Access to Means: No What has been your use of drugs/alcohol within the last 12 months?:  (see SA section) How many times?: 2 Triggers for Past Attempts: Unpredictable Intentional Self Injurious Behavior: None Risk to Others: Homicidal Ideation: No Thoughts of Harm to Others: Yes-Currently Present (some towards her daughter) Comment - Thoughts of Harm to Others:  ("I won't hurt her") Current Homicidal Intent: No Current Homicidal Plan: No Access to Homicidal Means: No History of harm to others?: No Assessment of Violence: None Noted Does patient have access to weapons?: No Criminal Charges Pending?: No Does patient have a court date: No Prior Inpatient Therapy: Prior Inpatient Therapy: Yes Prior Therapy Dates: unk Prior Therapy Facilty/Provider(s): [facility] Reason for Treatment: schizophrenia Prior Outpatient Therapy: Prior Outpatient Therapy: No Does patient have an ACCT team?: No Does patient have Intensive In-House Services?  : No Does patient have [facility] services? : No Does patient have P4CC services?: No  Past Medical History:  Past Medical History:  Diagnosis Date  . Schizophrenia ([place])    History reviewed. No pertinent surgical history. Family History: No family history on file. Family Psychiatric  History: denies Social History:  History  Alcohol Use  . Yes    Comment: rarely     History  Drug Use  . Types: Marijuana    Social History   Social History  . Marital status: Single    Spouse name: N/A  . Number of children: N/A  . Years of education: N/A   Social History Main Topics  . Smoking status: Current Every Day Smoker    Packs/day: 0.50  . Smokeless tobacco: Never Used  . Alcohol use Yes     Comment: rarely  . Drug use: Yes    Types: Marijuana  . Sexual activity: Not Asked   Other Topics Concern  . None   Social History Narrative  . None   Additional  Social History:    Allergies:   Allergies  Allergen Reactions  . Flexeril [Cyclobenzaprine]     Swelling of tongue and bumps on tongue  . Sulfa Antibiotics   . Tramadol     Labs:  Results for orders placed or performed during the hospital encounter of 08/12/16 (from the past 48 hour(s))  Comprehensive metabolic panel     Status: Abnormal   Collection Time: 08/12/16  3:53 AM  Result Value Ref Range   Sodium 136 135 - 145 mmol/L   Potassium 3.7 3.5 - 5.1 mmol/L   Chloride 102 101 - 111 mmol/L   CO2 22 22 - 32 mmol/L   Glucose, Bld 130 (H) 65 - 99 mg/dL   BUN 11 6 - 20 mg/dL   Creatinine, Ser 0.79 0.44 - 1.00 mg/dL   Calcium 9.4 8.9 - 10.3 mg/dL   Total Protein 8.1 6.5 - 8.1 g/dL   Albumin 4.2 3.5 - 5.0 g/dL   AST 29 15 - 41 U/L  ALT 24 14 - 54 U/L   Alkaline Phosphatase 101 38 - 126 U/L   Total Bilirubin 0.9 0.3 - 1.2 mg/dL   GFR calc non Af Amer >60 >60 mL/min   GFR calc Af Amer >60 >60 mL/min    Comment: (NOTE) The eGFR has been calculated using the CKD EPI equation. This calculation has not been validated in all clinical situations. eGFR's persistently <60 mL/min signify possible Chronic Kidney Disease.    Anion gap 12 5 - 15  Ethanol     Status: None   Collection Time: 08/12/16  3:53 AM  Result Value Ref Range   Alcohol, Ethyl (B) <5 <5 mg/dL    Comment:        LOWEST DETECTABLE LIMIT FOR SERUM ALCOHOL IS 5 mg/dL FOR MEDICAL PURPOSES ONLY   CBC with Diff     Status: Abnormal   Collection Time: 08/12/16  3:53 AM  Result Value Ref Range   WBC 11.5 (H) 4.0 - 10.5 K/uL   RBC 4.43 3.87 - 5.11 MIL/uL   Hemoglobin 11.2 (L) 12.0 - 15.0 g/dL   HCT 34.6 (L) 36.0 - 46.0 %   MCV 78.1 78.0 - 100.0 fL   MCH 25.3 (L) 26.0 - 34.0 pg   MCHC 32.4 30.0 - 36.0 g/dL   RDW 17.2 (H) 11.5 - 15.5 %   Platelets 257 150 - 400 K/uL   Neutrophils Relative % 50 %   Lymphocytes Relative 37 %   Monocytes Relative 12 %   Eosinophils Relative 1 %   Basophils Relative 0 %   Neutro  Abs 5.7 1.7 - 7.7 K/uL   Lymphs Abs 4.3 (H) 0.7 - 4.0 K/uL   Monocytes Absolute 1.4 (H) 0.1 - 1.0 K/uL   Eosinophils Absolute 0.1 0.0 - 0.7 K/uL   Basophils Absolute 0.0 0.0 - 0.1 K/uL   WBC Morphology ATYPICAL LYMPHOCYTES     Current Facility-Administered Medications  Medication Dose Route Frequency Provider Last Rate Last Dose  . ibuprofen (ADVIL,MOTRIN) tablet 600 mg  600 mg Oral Q8H PRN Fredia Sorrow, MD      . LORazepam (ATIVAN) tablet 1 mg  1 mg Oral Q8H PRN Fredia Sorrow, MD   1 mg at 08/13/16 1015  . ondansetron (ZOFRAN) tablet 4 mg  4 mg Oral Q8H PRN Fredia Sorrow, MD       Current Outpatient Prescriptions  Medication Sig Dispense Refill  . acetaminophen (TYLENOL) 500 MG tablet Take 2 tablets (1,000 mg total) by mouth every 8 (eight) hours. Do not take more than 4000 mg of acetaminophen (Tylenol) in a 24-hour period. Please note that other medicines that you may be prescribed may have Tylenol as well. 30 tablet 0  . Aspirin-Acetaminophen-Caffeine (GOODY HEADACHE PO) Take 1 packet by mouth every 2 (two) hours as needed (headache).    . Cal Carb-Mag Hydrox-Simeth (ROLAIDS ADVANCED) 1000-200-40 MG CHEW Chew 2 tablets by mouth as needed (heart burn).    Marland Kitchen ibuprofen (ADVIL,MOTRIN) 200 MG tablet Take 400-600 mg by mouth every 6 (six) hours as needed for headache.    . methocarbamol (ROBAXIN) 500 MG tablet Take 1 tablet (500 mg total) by mouth 2 (two) times daily. 20 tablet 0    Musculoskeletal: UTO, camera  Psychiatric Specialty Exam: Physical Exam  Review of Systems  Psychiatric/Behavioral: Positive for depression, hallucinations (looks like she is responding to internal stimuli) and substance abuse (pt reports yes then will not answer; UDS PENDING). Negative for suicidal ideas. The patient is  nervous/anxious and has insomnia.   All other systems reviewed and are negative.   Blood pressure (!) 107/56, pulse (!) 103, temperature 98.3 F (36.8 C), temperature source Oral,  resp. rate 18, height 5' 1"  (1.549 m), weight 76.2 kg (168 lb), SpO2 100 %.Body mass index is 31.74 kg/m.  General Appearance: Bizarre  Eye Contact:  Fair  Speech:  Clear and Coherent and Pressured  Volume:  Increased  Mood:  Anxious and Irritable  Affect:  Appropriate, Congruent and Labile  Thought Process:  Coherent, Goal Directed, Linear and Descriptions of Associations: Tangential  Orientation:  Full (Time, Place, and Person)  Thought Content:  Focused on wanting to go home, wanting Seroquel  Suicidal Thoughts:  No  Homicidal Thoughts:  No  Memory:  Immediate;   Fair Recent;   Fair Remote;   Fair  Judgement:  Fair  Insight:  Fair  Psychomotor Activity:  Normal  Concentration:  Concentration: Fair and Attention Span: Fair  Recall:  [employer] of Knowledge:  Fair  Language:  Fair  Akathisia:  No  Handed:    AIMS (if indicated):     Assets:  Communication Skills Desire for Improvement Resilience Social Support  ADL's:  Intact but may be impaired if pt continues to be manic  Cognition:  WNL  Sleep:       Treatment Plan Summary: Severe manic bipolar 1 disorder with psychotic behavior ([place]) stable for outpatient management  Medications: -Restart home Seroquel 32m po bid and 534mpo qhs (8am, 5pm, then qhs) -May give Zyprexa 90m6mo Zydis (sublingual) or 90mg69m q8h prn agitation, Qtc is 450's and stable  *After careful reevaluation by the EDP Dr. WentEulis Foster has been determined that the pt was transiently agitated and that her demeanor, overall in the ED, is stable enough for outpatient and would not be labile enough to justify IVC. Therefore, pt does not wish to voluntarily sign in for treatment and opts to go outpatient.   Disposition: No evidence of imminent risk to self or others at present.   Patient does not meet criteria for psychiatric inpatient admission. Supportive therapy provided about ongoing stressors. Discussed crisis plan, support from social network,  calling 911, coming to the Emergency Department, and calling Suicide Hotline.  WithBenjamine MolaP [place]0/2018 1:57 PM

## 2016-08-13 NOTE — ED Notes (Signed)
Psych eval in progress.

## 2016-08-13 NOTE — Discharge Instructions (Signed)
Avoid using illegal drugs  Start taking your Seroquel prescription as soon as possible  [facility] [place], for further help, with your anxiety, and for help getting your medications

## 2016-08-13 NOTE — ED Provider Notes (Signed)
15: 00-I was asked to see the patient, to assess her for unstable psychiatric condition.  She was seen by the nurse practitioner from the psychiatry service who was concerned that she was at risk for decompensation because of "mania".  At this time the patient is alert, cooperative and calm.  She denies suicidal ideation, homicidal ideation, hearing voices, or ongoing drug use.  She does admit to using "mildly", several days ago.  She states that she came here to "take chill".  [place], and wants to go home to [place].  She has been staying in a motel, but ran out of money, prior to coming here.  She plans on calling her father for a ride.  We will order a dose of Seroquel, and refill her prescription for  It.  Plan[facility] [place], for ongoing psychiatric care.  Avoid using illegal substances.   Mancel BaleElliott Terriyah Westra, MD 08/13/16 1515

## 2016-08-13 NOTE — Progress Notes (Signed)
[facility] attending Dr. Elna BreslowEappen. Report number is [id number]. [facility]  Ilean SkillMeghan Riddick Nuon, MSW, LCSW Clinical Social Work, Disposition  08/13/2016 [phone]

## 2016-08-13 NOTE — ED Notes (Signed)
Patient was given a Coke, and A Regular Diet was taken for Lunch.
